# Patient Record
Sex: Male | Born: 1970 | Race: White | Hispanic: No | Marital: Married | Smoking: Former smoker
Health system: Southern US, Community
[De-identification: ages and names within clinical notes are randomized; demographics above are authoritative.]

## PROBLEM LIST (undated history)

## (undated) DIAGNOSIS — B019 Varicella without complication: Secondary | ICD-10-CM

## (undated) DIAGNOSIS — K759 Inflammatory liver disease, unspecified: Secondary | ICD-10-CM

## (undated) DIAGNOSIS — J45909 Unspecified asthma, uncomplicated: Secondary | ICD-10-CM

## (undated) HISTORY — DX: Inflammatory liver disease, unspecified: K75.9

## (undated) HISTORY — DX: Varicella without complication: B01.9

## (undated) HISTORY — DX: Unspecified asthma, uncomplicated: J45.909

---

## 2001-10-14 HISTORY — PX: BILATERAL CARPAL TUNNEL RELEASE: SHX6508

## 2020-02-09 ENCOUNTER — Other Ambulatory Visit: Payer: Self-pay

## 2020-02-09 ENCOUNTER — Encounter: Payer: Self-pay | Admitting: Nurse Practitioner

## 2020-02-09 ENCOUNTER — Ambulatory Visit: Payer: No Typology Code available for payment source | Admitting: Nurse Practitioner

## 2020-02-09 VITALS — BP 138/80 | HR 86 | Temp 98.1°F | Ht 71.0 in | Wt 255.0 lb

## 2020-02-09 DIAGNOSIS — E669 Obesity, unspecified: Secondary | ICD-10-CM

## 2020-02-09 DIAGNOSIS — Z0000 Encounter for general adult medical examination without abnormal findings: Secondary | ICD-10-CM

## 2020-02-09 DIAGNOSIS — Z23 Encounter for immunization: Secondary | ICD-10-CM | POA: Diagnosis not present

## 2020-02-09 DIAGNOSIS — R109 Unspecified abdominal pain: Secondary | ICD-10-CM | POA: Insufficient documentation

## 2020-02-09 DIAGNOSIS — R319 Hematuria, unspecified: Secondary | ICD-10-CM

## 2020-02-09 DIAGNOSIS — Z1211 Encounter for screening for malignant neoplasm of colon: Secondary | ICD-10-CM | POA: Diagnosis not present

## 2020-02-09 LAB — COMPREHENSIVE METABOLIC PANEL
ALT: 23 U/L (ref 0–53)
AST: 21 U/L (ref 0–37)
Albumin: 4.2 g/dL (ref 3.5–5.2)
Alkaline Phosphatase: 53 U/L (ref 39–117)
BUN: 16 mg/dL (ref 6–23)
CO2: 27 mEq/L (ref 19–32)
Calcium: 9 mg/dL (ref 8.4–10.5)
Chloride: 103 mEq/L (ref 96–112)
Creatinine, Ser: 0.96 mg/dL (ref 0.40–1.50)
GFR: 82.86 mL/min (ref >60.00)
Glucose, Bld: 98 mg/dL (ref 70–99)
Potassium: 4.1 mEq/L (ref 3.5–5.1)
Sodium: 138 mEq/L (ref 135–145)
Total Bilirubin: 0.7 mg/dL (ref 0.2–1.2)
Total Protein: 7.3 g/dL (ref 6.0–8.3)

## 2020-02-09 LAB — HEMOGLOBIN A1C: Hgb A1c MFr Bld: 5.3 % (ref 4.6–6.5)

## 2020-02-09 LAB — LIPID PANEL
Cholesterol: 127 mg/dL (ref 0–200)
HDL: 24.3 mg/dL — ABNORMAL LOW (ref >39.00)
LDL Cholesterol: 82 mg/dL (ref 0–99)
NonHDL: 102.58
Total CHOL/HDL Ratio: 5
Triglycerides: 101 mg/dL (ref 0.0–149.0)
VLDL: 20.2 mg/dL (ref 0.0–40.0)

## 2020-02-09 LAB — URINALYSIS, ROUTINE W REFLEX MICROSCOPIC
Bilirubin Urine: NEGATIVE
Ketones, ur: NEGATIVE
Leukocytes,Ua: NEGATIVE
Nitrite: NEGATIVE
Specific Gravity, Urine: <=1.005 — AB (ref 1.000–1.030)
Total Protein, Urine: NEGATIVE
Urine Glucose: NEGATIVE
Urobilinogen, UA: 0.2 (ref 0.0–1.0)
pH: 6.5 (ref 5.0–8.0)

## 2020-02-09 LAB — TSH: TSH: 1.33 u[IU]/mL (ref 0.35–4.50)

## 2020-02-09 NOTE — Assessment & Plan Note (Signed)
Counseled re diet/exercise.

## 2020-02-09 NOTE — Assessment & Plan Note (Signed)
Colonoscopy referral. Advised dentist and eye exam. Wt loss and healthy diet.

## 2020-02-09 NOTE — Assessment & Plan Note (Signed)
Seems to be very mild -non concerning MSK today. Monitor.

## 2020-02-09 NOTE — Patient Instructions (Addendum)
It was nice to meet you today.   Please go to the lab. We will contact you by My Chart or telephone if you prefer with the results when they return.   Please see your dentist and eye doctor for routine exams.   I have placed a referral for a screening colonoscopy since you are 49 years old. You will be contacted about an appointment. Call us back if you do not get contacted in the next 2-3 weeks.   Come back for exam if you get the side pain back again and need further evaluation.   Your BP is a little elevated today and goal is <120/<80. Please check your BP at home and bringing a record.  Continue to work on diet and exercise and healthy weight. Follow-up in 3  mos or so to review.    Preventive Care 74-67 Years Old, Male Preventive care refers to lifestyle choices and visits with your health care provider that can promote health and wellness. This includes:  A yearly physical exam. This is also called an annual well check.  Regular dental and eye exams.  Immunizations.  Screening for certain conditions.  Healthy lifestyle choices, such as eating a healthy diet, getting regular exercise, not using drugs or products that contain nicotine and tobacco, and limiting alcohol use. What can I expect for my preventive care visit? Physical exam Your health care provider will check:  Height and weight. These may be used to calculate body mass index (BMI), which is a measurement that tells if you are at a healthy weight.  Heart rate and blood pressure.  Your skin for abnormal spots. Counseling Your health care provider may ask you questions about:  Alcohol, tobacco, and drug use.  Emotional well-being.  Home and relationship well-being.  Sexual activity.  Eating habits.  Work and work Statistician. What immunizations do I need?  Influenza (flu) vaccine  This is recommended every year. Tetanus, diphtheria, and pertussis (Tdap) vaccine  You may need a Td booster every 10  years. Varicella (chickenpox) vaccine  You may need this vaccine if you have not already been vaccinated. Zoster (shingles) vaccine  You may need this after age 21. Measles, mumps, and rubella (MMR) vaccine  You may need at least one dose of MMR if you were born in 1957 or later. You may also need a second dose. Pneumococcal conjugate (PCV13) vaccine  You may need this if you have certain conditions and were not previously vaccinated. Pneumococcal polysaccharide (PPSV23) vaccine  You may need one or two doses if you smoke cigarettes or if you have certain conditions. Meningococcal conjugate (MenACWY) vaccine  You may need this if you have certain conditions. Hepatitis A vaccine  You may need this if you have certain conditions or if you travel or work in places where you may be exposed to hepatitis A. Hepatitis B vaccine  You may need this if you have certain conditions or if you travel or work in places where you may be exposed to hepatitis B. Haemophilus influenzae type b (Hib) vaccine  You may need this if you have certain risk factors. Human papillomavirus (HPV) vaccine  If recommended by your health care provider, you may need three doses over 6 months. You may receive vaccines as individual doses or as more than one vaccine together in one shot (combination vaccines). Talk with your health care provider about the risks and benefits of combination vaccines. What tests do I need? Blood tests  Lipid and  cholesterol levels. These may be checked every 5 years, or more frequently if you are over 50 years old.  Hepatitis C test.  Hepatitis B test. Screening  Lung cancer screening. You may have this screening every year starting at age 55 if you have a 30-pack-year history of smoking and currently smoke or have quit within the past 15 years.  Prostate cancer screening. Recommendations will vary depending on your family history and other risks.  Colorectal cancer screening.  All adults should have this screening starting at age 50 and continuing until age 75. Your health care provider may recommend screening at age 45 if you are at increased risk. You will have tests every 1-10 years, depending on your results and the type of screening test.  Diabetes screening. This is done by checking your blood sugar (glucose) after you have not eaten for a while (fasting). You may have this done every 1-3 years.  Sexually transmitted disease (STD) testing. Follow these instructions at home: Eating and drinking  Eat a diet that includes fresh fruits and vegetables, whole grains, lean protein, and low-fat dairy products.  Take vitamin and mineral supplements as recommended by your health care provider.  Do not drink alcohol if your health care provider tells you not to drink.  If you drink alcohol: ? Limit how much you have to 0-2 drinks a day. ? Be aware of how much alcohol is in your drink. In the [place] one drink equals one 12 oz bottle of beer (355 mL), one 5 oz glass of wine (148 mL), or one 1 oz glass of hard liquor (44 mL). Lifestyle  Take daily care of your teeth and gums.  Stay active. Exercise for at least 30 minutes on 5 or more days each week.  Do not use any products that contain nicotine or tobacco, such as cigarettes, e-cigarettes, and chewing tobacco. If you need help quitting, ask your health care provider.  If you are sexually active, practice safe sex. Use a condom or other form of protection to prevent STIs (sexually transmitted infections).  Talk with your health care provider about taking a low-dose aspirin every day starting at age 50. What's next?  Go to your health care provider once a year for a well check visit.  Ask your health care provider how often you should have your eyes and teeth checked.  Stay up to date on all vaccines. This information is not intended to replace advice given to you by your health care provider. Make sure you  discuss any questions you have with your health care provider. Document Revised: 09/24/2018 Document Reviewed: 09/24/2018 Elsevier Patient Education  2020 Elsevier Inc.  

## 2020-02-09 NOTE — Assessment & Plan Note (Signed)
Check UA and culture 

## 2020-02-09 NOTE — Progress Notes (Signed)
New Patient Office Visit  Subjective:  Patient ID: Johnny Francis, male    DOB: May 03, 1971  Age: 49 y.o. MRN: [id number]  CC:  Chief Complaint  Patient presents with  . New Patient (Initial Visit)    establish care    HPI Johnny Francis presents to establish care with a  primary care provider. 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 He was told he needed to get this checked out before he can be passed for the DOT physical.   He has never seen blood in the urine.  He has no dysuria, urgency, frequency, trouble with his stream or prostate.  No nocturnal awakening.  He has noted intermittent pain on his lateral sides below his waist and over the last several months.  He is a Programmer, systems, and does sit for long periods of time, and he reports poor posture.  Several months ago, while driving in [place] he had one episode of right sharp pain in his lateral side that was quite bad and lasted a couple of days. It resolved without medication and he did not seek treatment. He has a little pain now in right lateral posterior side area that is reproducible with twisting. He says it feels like a muscle soreness. He has been playing soccer with the boys. He drinks plenty of water. 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 No personal hx of kidney stones. No lower back pain.   Asthma: as child-outgrew Hepatitis jaundice as a child   FH: Father esophageal ca  Patient presents today for complete physical.  Immunizations:None recently. Declines Covid vaccine and has no plans to get it. Agrees with TDap Diet: fair  Exercise: not active in gym Colonoscopy: due now at 61 and he agrees Vision: no Dental: no  Past Medical History:  Diagnosis Date  . Asthma   . Chicken pox   . Hepatitis    jaundice as a child     Past Surgical History:  Procedure Laterality Date  . BILATERAL CARPAL TUNNEL RELEASE Bilateral 2003    Family History  Problem  Relation Age of Onset  . Cancer Father   . Diabetes Father   . Hypertension Father   . Learning disabilities Father     Social History   Socioeconomic History  . Marital status: Married    Spouse name: Not on file  . Number of children: Not on file  . Years of education: Not on file  . Highest education level: High school graduate  Occupational History  . Occupation: Truck Geophysicist/field seismologist  Tobacco Use  . Smoking status: Former Research scientist (life sciences)  . Smokeless tobacco: Current User    Types: Chew  Substance and Sexual Activity  . Alcohol use: Yes  . Drug use: Never  . Sexual activity: Yes  Other Topics Concern  . Not on file  Social History Narrative   Married and 2 step children    Social Determinants of Health   Financial Resource Strain:   . Difficulty of Paying Living Expenses:   Food Insecurity:   . Worried About Charity fundraiser in the Last Year:   . Arboriculturist in the Last Year:   Transportation Needs:   . Film/video editor (Medical):   Marland Kitchen Lack of Transportation (Non-Medical):   Physical Activity:   . Days of Exercise per Week:   . Minutes of Exercise per Session:   Stress:   .  Feeling of Stress :   Social Connections:   . Frequency of Communication with Friends and Family:   . Frequency of Social Gatherings with Friends and Family:   . Attends Religious Services:   . Active Member of Clubs or Organizations:   . Attends Archivist Meetings:   Marland Kitchen Marital Status:   Intimate Partner Violence:   . Fear of Current or Ex-Partner:   . Emotionally Abused:   Marland Kitchen Physically Abused:   . Sexually Abused:     ROS Review of Systems  Constitutional: Negative for appetite change, chills and fever.  HENT: Negative for congestion and sore throat.   Eyes: Negative.   Respiratory: Negative for cough and shortness of breath.   Cardiovascular: Negative for chest pain, palpitations and leg swelling.  Gastrointestinal: Negative.   Endocrine: Negative for cold intolerance,  heat intolerance and polydipsia.  Genitourinary: Positive for hematuria. Negative for discharge, flank pain, penile pain, penile swelling, testicular pain and urgency.  Musculoskeletal: Negative.   Skin: Negative.   Hematological: Negative.   Psychiatric/Behavioral:       No depression or anxiety concerns    Objective:   Today's Vitals: BP 138/80 (BP Location: Left Arm, Patient Position: Sitting, Cuff Size: Small)   Pulse 86   Temp 98.1 F (36.7 C) (Skin)   Ht 5' 11"  (1.803 m)   Wt 255 lb (115.7 kg)   SpO2 98%   BMI 35.57 kg/m   Physical Exam Vitals reviewed.  Constitutional:      Appearance: Normal appearance. He is obese.  HENT:     Head: Normocephalic and atraumatic.     Right Ear: Tympanic membrane normal.     Left Ear: Tympanic membrane normal.  Eyes:     Extraocular Movements: Extraocular movements intact.     Conjunctiva/sclera: Conjunctivae normal.     Pupils: Pupils are equal, round, and reactive to light.  Cardiovascular:     Rate and Rhythm: Normal rate and regular rhythm.     Pulses: Normal pulses.     Heart sounds: Normal heart sounds.  Pulmonary:     Effort: Pulmonary effort is normal.     Breath sounds: Normal breath sounds.  Abdominal:     Palpations: Abdomen is soft.     Tenderness: There is no abdominal tenderness.  Musculoskeletal:        General: No swelling or tenderness. Normal range of motion.     Cervical back: Normal range of motion and neck supple.     Right lower leg: No edema.     Left lower leg: No edema.  Skin:    General: Skin is warm and dry.  Neurological:     General: No focal deficit present.     Mental Status: He is alert and oriented to person, place, and time.  Psychiatric:        Mood and Affect: Mood normal.        Behavior: Behavior normal.     Assessment & Plan:   Problem List Items Addressed This Visit      Other   Hematuria - Primary    Check UA and culture.       Relevant Orders   Urinalysis, Routine w  reflex microscopic   Urine Culture   CBC with Differential/Platelet   Comp Met (CMET)   Side pain    Seems to be very mild -non concerning MSK today. Monitor.      Preventative health care    Colonoscopy referral.  Advised dentist and eye exam. Wt loss and healthy diet.       Relevant Orders   Lipid Profile   HgB A1c   TSH   HIV antibody (with reflex)   Obesity (BMI 35.0-39.9 without comorbidity)    Counseled re diet/exercise.        Other Visit Diagnoses    Colon cancer screening       Relevant Orders   Ambulatory referral to Gastroenterology   Need for Tdap vaccination       Relevant Orders   Tdap vaccine greater than or equal to 7yo IM (Completed)      Outpatient Encounter Medications as of 02/09/2020  Medication Sig  . Ascorbic Acid (VITAMIN C) 1000 MG tablet Take 1,000 mg by mouth daily.   No facility-administered encounter medications on file as of 02/09/2020.  Please go to the lab. We will contact you by My Chart or telephone if you prefer with the results when they return.   Please see your dentist and eye doctor for routine exams.   I have placed a referral for a screening colonoscopy since you are 49 years old. You will be contacted about an appointment. Call us back if you do not get contacted in the next 2-3 weeks.   Come back for exam if you get the side pain back again and need further evaluation.   Your BP is a little elevated today and goal is <120/<80. Please check your BP at home and bringing a record.  Continue to work on diet and exercise and healthy weight. Follow-up in 3  mos or so to review.    Follow-up: Return in about 3 months (around 05/10/2020).   This visit occurred during the SARS-CoV-2 public health emergency.  Safety protocols were in place, including screening questions prior to the visit, additional usage of staff PPE, and extensive cleaning of exam room while observing appropriate contact time as indicated for disinfecting solutions.    Denice Paradise, NP

## 2020-02-10 ENCOUNTER — Telehealth: Payer: Self-pay | Admitting: Nurse Practitioner

## 2020-02-10 DIAGNOSIS — R109 Unspecified abdominal pain: Secondary | ICD-10-CM

## 2020-02-10 DIAGNOSIS — R319 Hematuria, unspecified: Secondary | ICD-10-CM

## 2020-02-10 LAB — CBC WITH DIFFERENTIAL/PLATELET
Basophils Absolute: 0 10*3/uL (ref 0.0–0.1)
Basophils Relative: 0.7 % (ref 0.0–3.0)
Eosinophils Absolute: 0.1 10*3/uL (ref 0.0–0.7)
Eosinophils Relative: 2.1 % (ref 0.0–5.0)
HCT: 44.2 % (ref 39.0–52.0)
Hemoglobin: 15.2 g/dL (ref 13.0–17.0)
Lymphocytes Relative: 34.8 % (ref 12.0–46.0)
Lymphs Abs: 1.9 10*3/uL (ref 0.7–4.0)
MCHC: 34.3 g/dL (ref 30.0–36.0)
MCV: 93.4 fl (ref 78.0–100.0)
Monocytes Absolute: 0.7 10*3/uL (ref 0.1–1.0)
Monocytes Relative: 12.9 % — ABNORMAL HIGH (ref 3.0–12.0)
Neutro Abs: 2.6 10*3/uL (ref 1.4–7.7)
Neutrophils Relative %: 49.5 % (ref 43.0–77.0)
Platelets: 198 10*3/uL (ref 150.0–400.0)
RBC: 4.73 Mil/uL (ref 4.22–5.81)
RDW: 12.8 % (ref 11.5–15.5)
WBC: 5.3 10*3/uL (ref 4.0–10.5)

## 2020-02-10 LAB — HIV ANTIBODY (ROUTINE TESTING W REFLEX): HIV 1&2 Ab, 4th Generation: NONREACTIVE

## 2020-02-10 LAB — URINE CULTURE
MICRO NUMBER:: 10415795
Result:: NO GROWTH
SPECIMEN QUALITY:: ADEQUATE

## 2020-02-10 NOTE — Telephone Encounter (Signed)
Pt is scheduled for his CT abdomen pelvis w/out contrast

## 2020-02-10 NOTE — Telephone Encounter (Signed)
Please call him with results: He has trace blood in the urine.  He also was reporting side pain right side now. We need to rule out kidney stones and other kidney problems. Your blood work showed no kidney problems.  I will order CT of the abdomen pelvis without contrast low radiation.  His LIPIDS: HDL is low- increase activity and healthy diet. We can re check on follow-up.

## 2020-02-10 NOTE — Telephone Encounter (Signed)
See below messages

## 2020-02-10 NOTE — Telephone Encounter (Signed)
CT abd/pelvis without contrast, right? No contrast.   Sometimes we write W/WO which means both with contrast and without. Contrast. So, I want to make sure his is only without.

## 2020-02-10 NOTE — Telephone Encounter (Addendum)
Spoke with patient about results. 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 Can you do the letter since the issue is being addressed here?

## 2020-02-10 NOTE — Telephone Encounter (Signed)
Its without contrast

## 2020-02-10 NOTE — Telephone Encounter (Signed)
Patient would like to come into office tomorrow morning at 8am to pick up letter. Please advise. Thanks

## 2020-02-10 NOTE — Telephone Encounter (Signed)
Can you address message below about patient needing letter?

## 2020-02-16 NOTE — Telephone Encounter (Signed)
He did not get the CT done,yet. I am looking for reason for blood in his urine and his side pain. We are looking for kidney stones.

## 2020-02-16 NOTE — Telephone Encounter (Signed)
Patient is calling back about the DOT letter he was wanting; I never got a message back this before. He states he needs it as soon as possible.

## 2020-02-16 NOTE — Telephone Encounter (Signed)
Spoke with patient about needing to have his CT done first before we can proceed with a letter. Patient is scheduled for CT on 02/18/20.

## 2020-02-16 NOTE — Progress Notes (Signed)
Spoke with patient and advised him to call [facility] to schedule.

## 2020-02-16 NOTE — Progress Notes (Signed)
Please call him

## 2020-02-18 ENCOUNTER — Ambulatory Visit
Admission: RE | Admit: 2020-02-18 | Discharge: 2020-02-18 | Disposition: A | Discharge status: Home / Self Care | Payer: No Typology Code available for payment source | Source: Ambulatory Visit | Attending: Nurse Practitioner | Admitting: Nurse Practitioner

## 2020-02-18 ENCOUNTER — Other Ambulatory Visit: Payer: Self-pay

## 2020-02-18 DIAGNOSIS — R319 Hematuria, unspecified: Secondary | ICD-10-CM | POA: Insufficient documentation

## 2020-02-18 DIAGNOSIS — R109 Unspecified abdominal pain: Secondary | ICD-10-CM | POA: Insufficient documentation

## 2020-02-18 NOTE — Telephone Encounter (Signed)
Pt would like a call back about CT

## 2020-02-18 NOTE — Telephone Encounter (Signed)
Waiting on results to be given before calling the patient

## 2020-02-21 ENCOUNTER — Telehealth: Payer: Self-pay | Admitting: Nurse Practitioner

## 2020-02-21 DIAGNOSIS — R319 Hematuria, unspecified: Secondary | ICD-10-CM

## 2020-02-21 DIAGNOSIS — N200 Calculus of kidney: Secondary | ICD-10-CM

## 2020-02-21 NOTE — Telephone Encounter (Signed)
I spoke to Johnny Francis about his CT study.  He had already read the report and my recommendations through my chart.  He is in full agreement to see a urologist.  He is a Agricultural consultant.  He would like to have a virtual urology appointment if possible for history of hematuria, and CT without contrast showing a 26mm   kidney stone -non obstructing in left kidney, and renals .   However, he will be back in town the week of July 21 and is clearing his schedule for doctor's appointment if needed.

## 2020-02-21 NOTE — Telephone Encounter (Signed)
Pt would like a call back regarding message sent from Amedeo Kinsman regarding his CT scan.

## 2020-02-21 NOTE — Telephone Encounter (Signed)
Patient would like to speak with you about his CT results

## 2020-02-25 NOTE — Telephone Encounter (Signed)
Referral was sent on 02/21/2020.

## 2020-02-28 ENCOUNTER — Telehealth: Payer: Self-pay | Admitting: Nurse Practitioner

## 2020-02-28 NOTE — Telephone Encounter (Signed)
Pt called wanted to know about papework for work can reach him at [phone]

## 2020-02-28 NOTE — Telephone Encounter (Signed)
Spoke with patient and his letter is up front for pickup

## 2020-04-11 ENCOUNTER — Other Ambulatory Visit: Payer: Self-pay

## 2020-04-11 DIAGNOSIS — Z1211 Encounter for screening for malignant neoplasm of colon: Secondary | ICD-10-CM

## 2020-04-12 ENCOUNTER — Telehealth (INDEPENDENT_AMBULATORY_CARE_PROVIDER_SITE_OTHER): Payer: Self-pay | Admitting: Gastroenterology

## 2020-04-12 ENCOUNTER — Other Ambulatory Visit: Payer: Self-pay

## 2020-04-12 DIAGNOSIS — Z1211 Encounter for screening for malignant neoplasm of colon: Secondary | ICD-10-CM

## 2020-04-12 MED ORDER — CLENPIQ 10-3.5-12 MG-GM -GM/160ML PO SOLN: 320.0000 mL | Freq: Once | ORAL | 0 refills | Status: AC

## 2020-04-12 NOTE — Progress Notes (Signed)
Gastroenterology Pre-Procedure Review  Request Date: 07/10/2020 Requesting Physician: Dr. Tobi Bastos  PATIENT REVIEW QUESTIONS: The patient responded to the following health history questions as indicated:    1. Are you having any GI issues? No  2. Do you have a personal history of Polyps? No  3. Do you have a family history of Colon Cancer or Polyps? NO  4. Diabetes Mellitus? No  5. Joint replacements in the past 12 months?No 6. Major health problems in the past 3 months?NO  7. Any artificial heart valves, MVP, or defibrillator? No     MEDICATIONS & ALLERGIES:    Patient reports the following regarding taking any anticoagulation/antiplatelet therapy:   Plavix, Coumadin, Eliquis, Xarelto, Lovenox, Pradaxa, Brilinta, or Effient? NO  Aspirin? NO   Patient confirms/reports the following medications:  Current Outpatient Medications  Medication Sig Dispense Refill  . Ascorbic Acid (VITAMIN C) 1000 MG tablet Take 1,000 mg by mouth daily.     No current facility-administered medications for this visit.    Patient confirms/reports the following allergies:  No Known Allergies  No orders of the defined types were placed in this encounter.   AUTHORIZATION INFORMATION Primary Insurance: 1D#: Group #:  Secondary Insurance: 1D#: Group #:  SCHEDULE INFORMATION: Date: 07/10/2020 Time: Location:

## 2020-05-03 ENCOUNTER — Ambulatory Visit: Payer: No Typology Code available for payment source | Admitting: Nurse Practitioner

## 2020-05-05 ENCOUNTER — Ambulatory Visit: Payer: No Typology Code available for payment source | Admitting: Urology

## 2020-05-05 ENCOUNTER — Encounter: Payer: Self-pay | Admitting: Urology

## 2020-07-06 ENCOUNTER — Telehealth: Payer: Self-pay

## 2020-07-06 ENCOUNTER — Other Ambulatory Visit: Admission: RE | Admit: 2020-07-06 | Payer: No Typology Code available for payment source | Source: Ambulatory Visit

## 2020-07-06 NOTE — Telephone Encounter (Signed)
Endo department called to inform us that patient dad died in [place] and he had to cancel his procedure please call back in several weeks to get procedure rescheduled. Will call patient

## 2020-07-10 ENCOUNTER — Encounter: Admission: RE | Payer: Self-pay | Source: Home / Self Care

## 2020-07-10 ENCOUNTER — Ambulatory Visit
Admission: RE | Admit: 2020-07-10 | Payer: No Typology Code available for payment source | Source: Home / Self Care | Admitting: Gastroenterology

## 2020-07-10 SURGERY — COLONOSCOPY WITH PROPOFOL
Anesthesia: General

## 2021-06-12 IMAGING — CT CT ABD-PELV W/O CM
1 of 2 series · 15 of 32 positions shown, 19 images · non-contrast
Comparison: None.

CLINICAL DATA: Hematuria.

EXAM:
CT ABDOMEN AND PELVIS WITHOUT CONTRAST
TECHNIQUE: Multidetector CT imaging of the abdomen and pelvis was performed
following the standard protocol without IV contrast.

[Series 2: axial st · axial · 0.83mm/px · z∈[-1082,-612]mm · 15 of 102 slices shown, 19 images]
[im 4/102  soft-tissue]
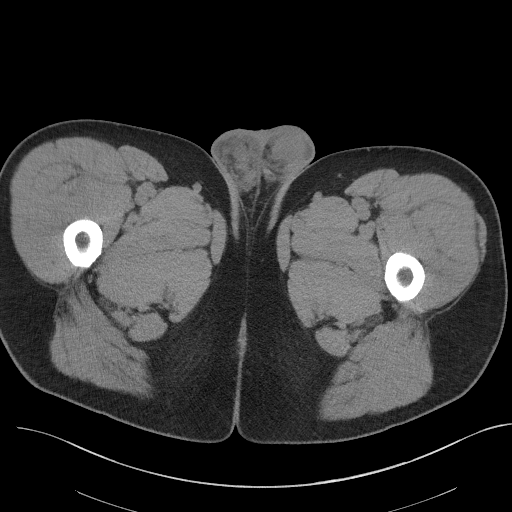
[im 4/102  bone]
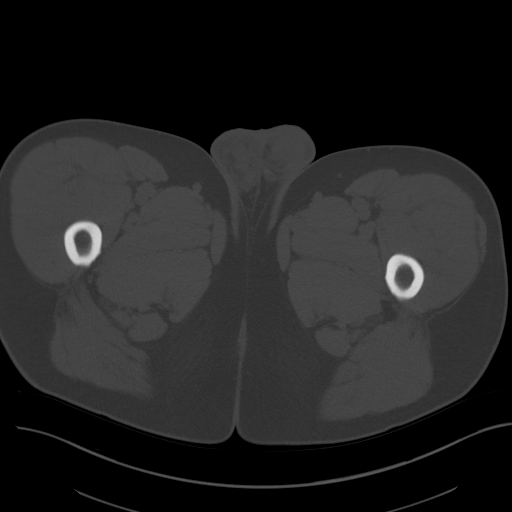
[im 12/102  soft-tissue]
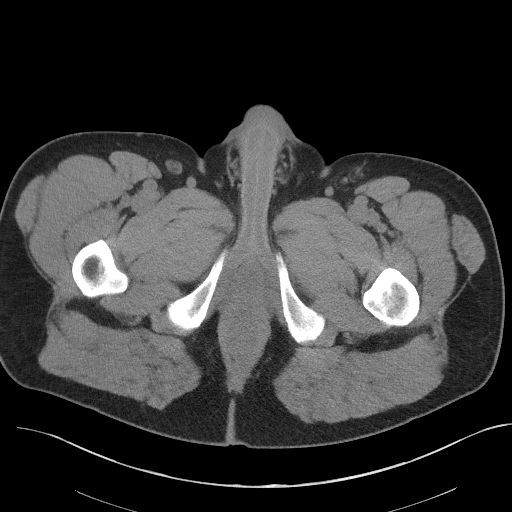
[im 20/102  soft-tissue]
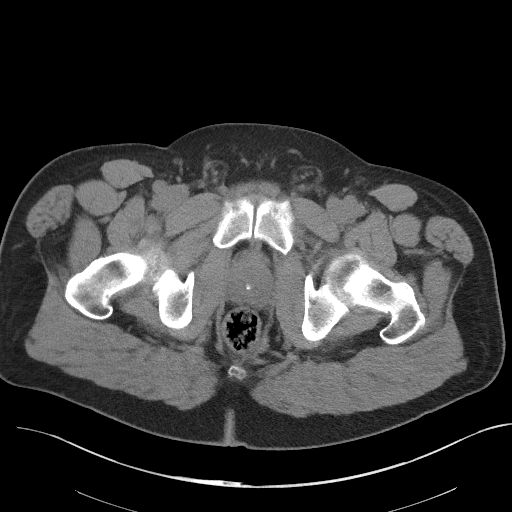
[im 28/102  soft-tissue]
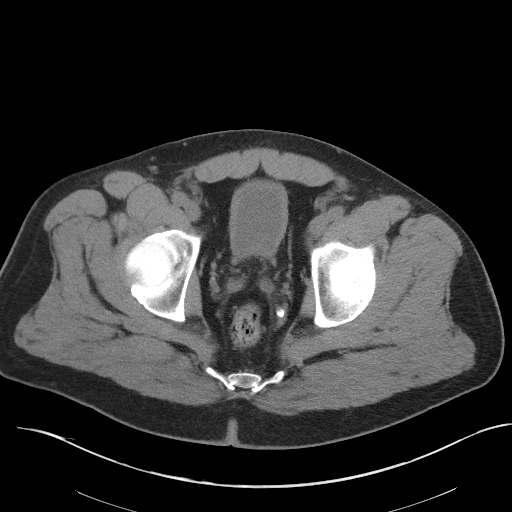
[im 35/102  soft-tissue]
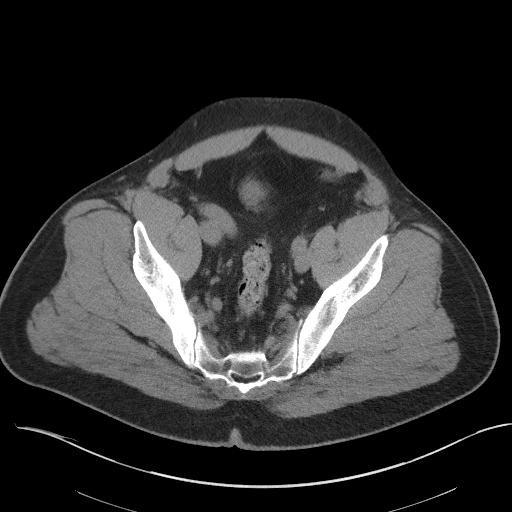
[im 43/102  soft-tissue]
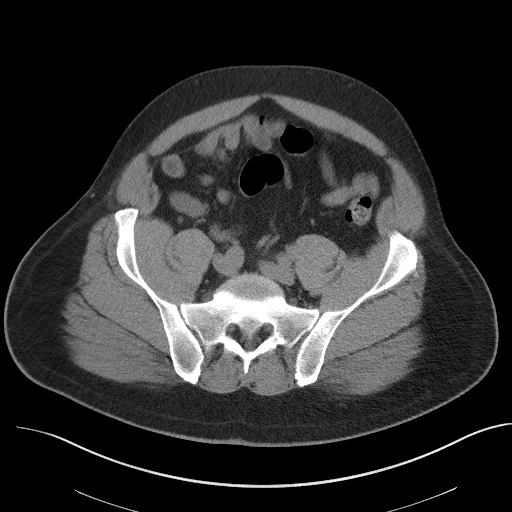
[im 51/102  soft-tissue]
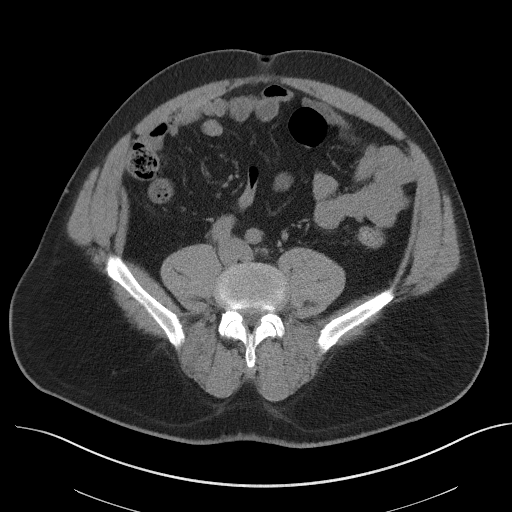
[im 59/102  soft-tissue]
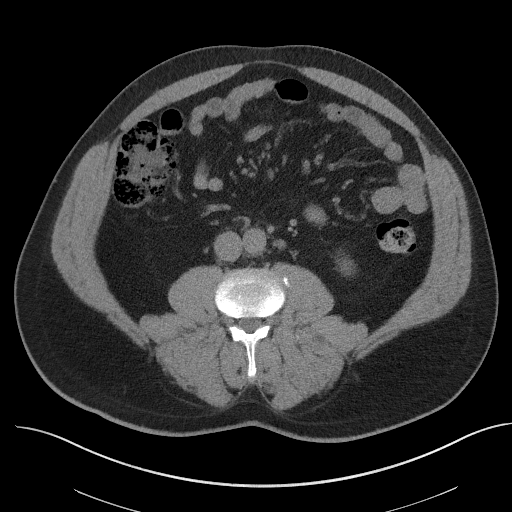
[im 67/102  soft-tissue]
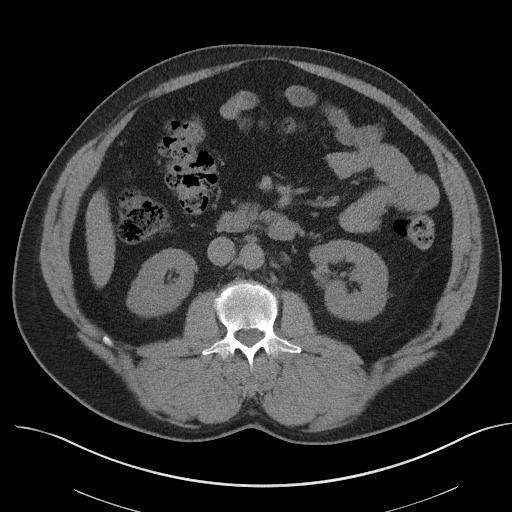
[im 67/102  bone]
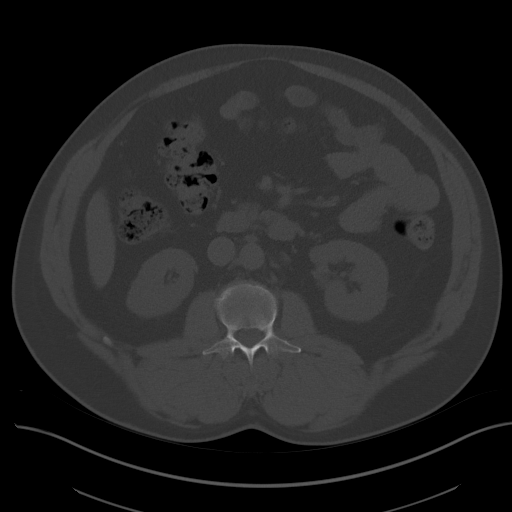
[im 74/102  soft-tissue]
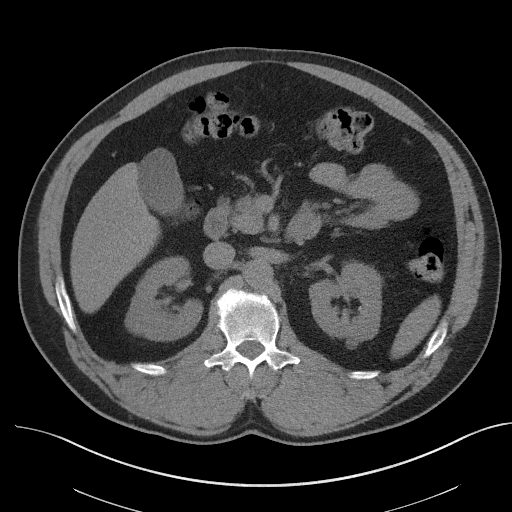
[im 82/102  soft-tissue]
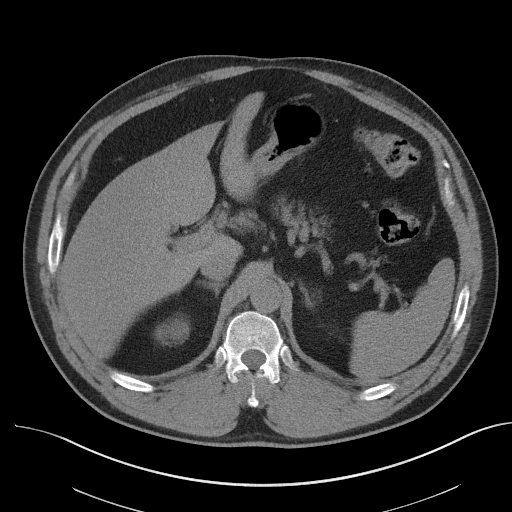
[im 86/102  lung]
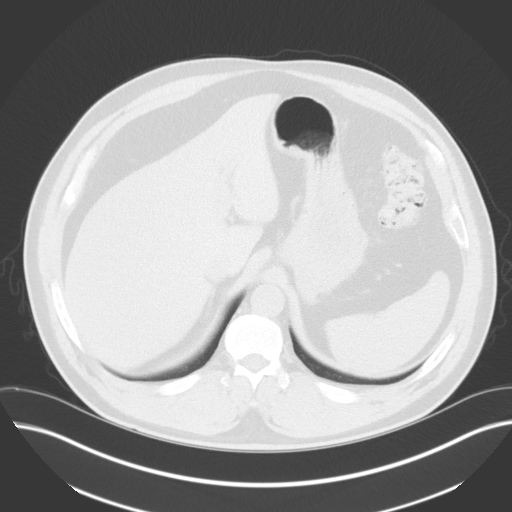
[im 90/102  soft-tissue]
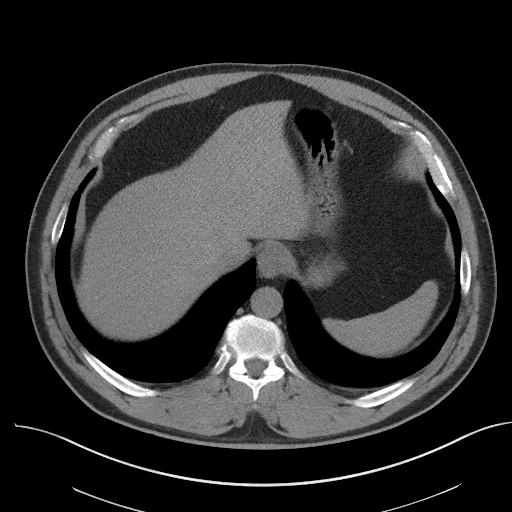
[im 90/102  lung]
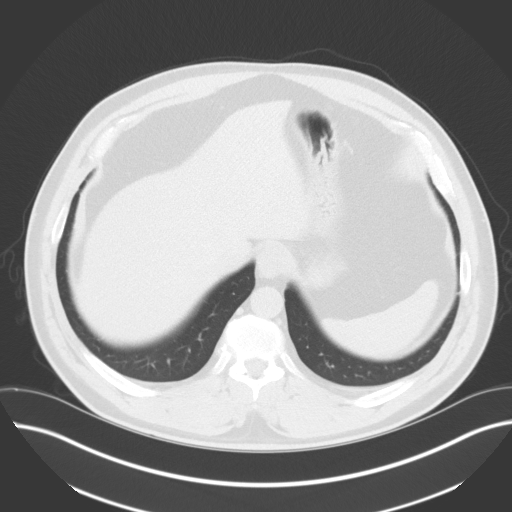
[im 94/102  lung]
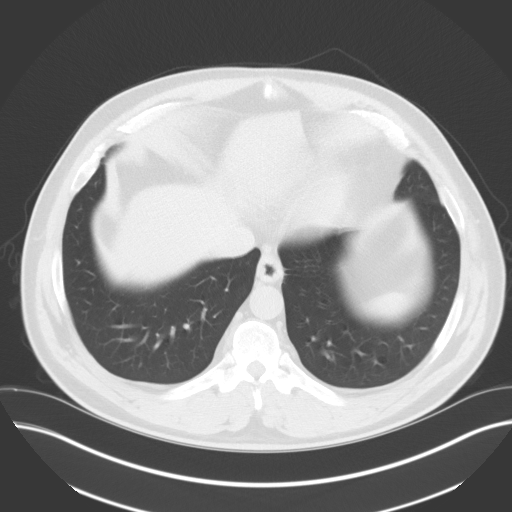
[im 98/102  soft-tissue]
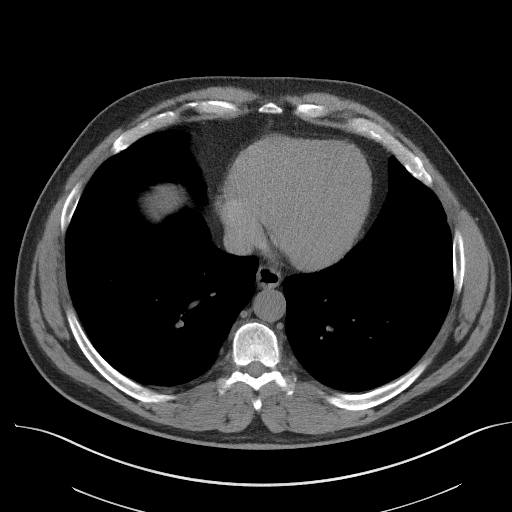
[im 98/102  lung]
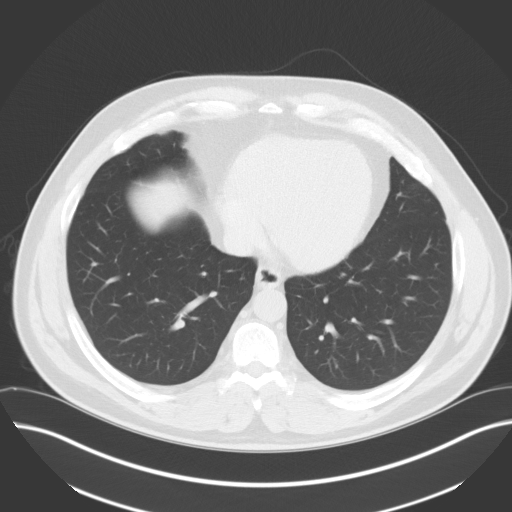

[15 of 32 positions shown; findings below may reference images not displayed]

FINDINGS: Lower chest: No acute abnormality.

Hepatobiliary: 5 mm low-density structure within segment 2 is too
small to characterize. The no suspicious liver abnormality. The
gallbladder appears within normal limits. No biliary dilatation.

Pancreas: Unremarkable. No pancreatic ductal dilatation or
surrounding inflammatory changes.

Spleen: Normal in size without focal abnormality.

Adrenals/Urinary Tract: Normal appearance of the adrenal glands.

Stone within upper pole of left kidney measures 3 mm. No right renal
calculi. Bilateral kidney cysts are noted, incompletely
characterized without IV contrast. Urinary bladder is unremarkable.

Stomach/Bowel: Stomach is within normal limits. Appendix appears
normal. No evidence of bowel wall thickening, distention, or
inflammatory changes.

Vascular/Lymphatic: No significant vascular findings are present. No
enlarged abdominal or pelvic lymph nodes.

Reproductive: Prostate is unremarkable.

Other: No abdominal wall hernia or abnormality. No abdominopelvic
ascites.

Musculoskeletal: No acute or suspicious osseous findings. Mild
lumbar degenerative disc disease at L1-2.
IMPRESSION: 1. No acute findings within the abdomen or pelvis.
2. Nonobstructing left renal calculus.

## 2022-04-14 ENCOUNTER — Ambulatory Visit (INDEPENDENT_AMBULATORY_CARE_PROVIDER_SITE_OTHER): Payer: Self-pay

## 2022-04-14 ENCOUNTER — Ambulatory Visit
Admission: EM | Admit: 2022-04-14 | Discharge: 2022-04-14 | Disposition: A | Discharge status: Home / Self Care | Payer: Self-pay | Attending: Internal Medicine | Admitting: Internal Medicine

## 2022-04-14 DIAGNOSIS — M25562 Pain in left knee: Secondary | ICD-10-CM

## 2022-04-14 DIAGNOSIS — S8992XA Unspecified injury of left lower leg, initial encounter: Secondary | ICD-10-CM

## 2022-04-14 NOTE — ED Triage Notes (Signed)
Patient presents to Urgent Care with complaints of L behind the knee pain since this morning when he attempted to step over his so leg and he felt a pop in the back of his knee, 8/ 10 pain. Pt reports 800mg  motrin prior to arrival .
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
## 2022-04-14 NOTE — Discharge Instructions (Signed)
Your x-ray was normal.  I suspect that you have a muscle strain/injury.  A knee brace has been applied.  Do not sleep in this.  Crutches have been supplied and advised nonweightbearing until otherwise advised by orthopedist.  Also recommend ice application and over-the-counter pain relievers as needed.  Please follow-up with orthopedist at provided contact information for further evaluation and management.
